# Patient Record
Sex: Female | Born: 1990 | Race: Black or African American | Hispanic: No | Marital: Single | Smoking: Never smoker
Health system: Southern US, Community
[De-identification: ages and names within clinical notes are randomized; demographics above are authoritative.]

## PROBLEM LIST (undated history)

## (undated) DIAGNOSIS — T7840XA Allergy, unspecified, initial encounter: Secondary | ICD-10-CM

## (undated) HISTORY — DX: Allergy, unspecified, initial encounter: T78.40XA

---
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
## 2008-01-22 ENCOUNTER — Emergency Department (HOSPITAL_COMMUNITY): Admission: EM | Admit: 2008-01-22 | Discharge: 2008-01-23 | Payer: Self-pay | Admitting: Emergency Medicine

## 2008-12-04 ENCOUNTER — Emergency Department (HOSPITAL_COMMUNITY): Admission: EM | Admit: 2008-12-04 | Discharge: 2008-12-04 | Payer: Self-pay | Admitting: Emergency Medicine

## 2009-09-25 ENCOUNTER — Emergency Department (HOSPITAL_COMMUNITY): Admission: EM | Admit: 2009-09-25 | Discharge: 2009-09-25 | Payer: Self-pay | Admitting: Emergency Medicine

## 2010-01-01 ENCOUNTER — Emergency Department (HOSPITAL_COMMUNITY)
Admission: EM | Admit: 2010-01-01 | Discharge: 2010-01-01 | Payer: Self-pay | Source: Home / Self Care | Admitting: Family Medicine

## 2010-03-21 ENCOUNTER — Inpatient Hospital Stay (INDEPENDENT_AMBULATORY_CARE_PROVIDER_SITE_OTHER)
Admission: RE | Admit: 2010-03-21 | Discharge: 2010-03-21 | Disposition: A | Discharge status: Home / Self Care | Payer: BC Managed Care – PPO | Source: Ambulatory Visit | Attending: Emergency Medicine | Admitting: Emergency Medicine

## 2010-03-21 DIAGNOSIS — N760 Acute vaginitis: Secondary | ICD-10-CM

## 2010-03-21 DIAGNOSIS — A499 Bacterial infection, unspecified: Secondary | ICD-10-CM

## 2010-03-21 LAB — WET PREP, GENITAL: Trich, Wet Prep: NONE SEEN

## 2010-03-21 LAB — POCT URINALYSIS DIPSTICK
Bilirubin Urine: NEGATIVE
Nitrite: NEGATIVE
Specific Gravity, Urine: 1.015 (ref 1.005–1.030)
Urobilinogen, UA: 0.2 mg/dL (ref 0.0–1.0)
pH: 7 (ref 5.0–8.0)

## 2010-03-22 LAB — GC/CHLAMYDIA PROBE AMP, GENITAL
Chlamydia, DNA Probe: NEGATIVE
GC Probe Amp, Genital: NEGATIVE

## 2010-04-06 LAB — POCT URINALYSIS DIPSTICK
Ketones, ur: NEGATIVE mg/dL
Nitrite: NEGATIVE
Protein, ur: 30 mg/dL — AB

## 2010-04-06 LAB — POCT PREGNANCY, URINE: Preg Test, Ur: NEGATIVE

## 2010-04-06 LAB — GC/CHLAMYDIA PROBE AMP, GENITAL: Chlamydia, DNA Probe: NEGATIVE

## 2010-04-06 LAB — WET PREP, GENITAL

## 2010-08-07 ENCOUNTER — Emergency Department (HOSPITAL_COMMUNITY): Payer: BC Managed Care – PPO

## 2010-08-07 ENCOUNTER — Emergency Department (HOSPITAL_COMMUNITY)
Admission: EM | Admit: 2010-08-07 | Discharge: 2010-08-07 | Disposition: A | Discharge status: Home / Self Care | Payer: BC Managed Care – PPO | Attending: Emergency Medicine | Admitting: Emergency Medicine

## 2010-08-07 DIAGNOSIS — R079 Chest pain, unspecified: Secondary | ICD-10-CM | POA: Insufficient documentation

## 2010-08-07 DIAGNOSIS — R0602 Shortness of breath: Secondary | ICD-10-CM | POA: Insufficient documentation

## 2010-08-07 DIAGNOSIS — R071 Chest pain on breathing: Secondary | ICD-10-CM | POA: Insufficient documentation

## 2010-10-27 LAB — STREP A DNA PROBE: Group A Strep Probe: NEGATIVE

## 2010-10-27 LAB — RAPID STREP SCREEN (MED CTR MEBANE ONLY): Streptococcus, Group A Screen (Direct): NEGATIVE

## 2011-07-10 ENCOUNTER — Encounter (HOSPITAL_COMMUNITY): Payer: Self-pay

## 2011-07-10 ENCOUNTER — Emergency Department (INDEPENDENT_AMBULATORY_CARE_PROVIDER_SITE_OTHER)
Admission: EM | Admit: 2011-07-10 | Discharge: 2011-07-10 | Disposition: A | Discharge status: Home / Self Care | Payer: Self-pay | Source: Home / Self Care | Attending: Family Medicine | Admitting: Family Medicine

## 2011-07-10 DIAGNOSIS — M62838 Other muscle spasm: Secondary | ICD-10-CM

## 2011-07-10 MED ORDER — CYCLOBENZAPRINE HCL 10 MG PO TABS: 10.0000 mg | ORAL_TABLET | Freq: Two times a day (BID) | ORAL | Status: AC | PRN

## 2011-07-10 MED ORDER — IBUPROFEN 800 MG PO TABS: 800.0000 mg | ORAL_TABLET | Freq: Three times a day (TID) | ORAL | Status: AC
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
## 2011-07-11 NOTE — ED Provider Notes (Signed)
Medical screening examination/treatment/procedure(s) were performed by non-physician practitioner and as supervising physician I was immediately available for consultation/collaboration.   MORENO-COLL,Kharma Sampsel; MD   Brooklynne Pereida Moreno-Coll, MD 07/11/11 0954 

## 2011-12-28 ENCOUNTER — Ambulatory Visit (INDEPENDENT_AMBULATORY_CARE_PROVIDER_SITE_OTHER): Payer: BC Managed Care – PPO | Admitting: Physician Assistant

## 2011-12-28 VITALS — BP 110/72 | HR 112 | Temp 102.5°F | Resp 20 | Ht 66.0 in | Wt 228.0 lb

## 2011-12-28 DIAGNOSIS — R05 Cough: Secondary | ICD-10-CM

## 2011-12-28 DIAGNOSIS — J029 Acute pharyngitis, unspecified: Secondary | ICD-10-CM

## 2011-12-28 DIAGNOSIS — R509 Fever, unspecified: Secondary | ICD-10-CM

## 2011-12-28 DIAGNOSIS — J111 Influenza due to unidentified influenza virus with other respiratory manifestations: Secondary | ICD-10-CM

## 2011-12-28 DIAGNOSIS — R079 Chest pain, unspecified: Secondary | ICD-10-CM

## 2011-12-28 DIAGNOSIS — J101 Influenza due to other identified influenza virus with other respiratory manifestations: Secondary | ICD-10-CM

## 2011-12-28 LAB — POCT INFLUENZA A/B: Influenza B, POC: NEGATIVE

## 2011-12-28 MED ORDER — IBUPROFEN 200 MG PO TABS: 400.0000 mg | ORAL_TABLET | Freq: Once | ORAL | Status: DC

## 2011-12-28 MED ORDER — OSELTAMIVIR PHOSPHATE 75 MG PO CAPS: 75.0000 mg | ORAL_CAPSULE | Freq: Two times a day (BID) | ORAL | Status: DC

## 2011-12-28 NOTE — Patient Instructions (Addendum)
Influenza, Adult Influenza ("the flu") is a viral infection of the respiratory tract. It occurs more often in winter months because people spend more time in close contact with one another. Influenza can make you feel very sick. Influenza easily spreads from person to person (contagious). CAUSES   Influenza is caused by a virus that infects the respiratory tract. You can catch the virus by breathing in droplets from an infected person's cough or sneeze. You can also catch the virus by touching something that was recently contaminated with the virus and then touching your mouth, nose, or eyes. SYMPTOMS   Symptoms typically last 4 to 10 days and may include:  Fever.   Chills.   Headache, body aches, and muscle aches.   Sore throat.   Chest discomfort and cough.   Poor appetite.   Weakness or feeling tired.   Dizziness.   Nausea or vomiting.  DIAGNOSIS   Diagnosis of influenza is often made based on your history and a physical exam. A nose or throat swab test can be done to confirm the diagnosis. RISKS AND COMPLICATIONS You may be at risk for a more severe case of influenza if you smoke cigarettes, have diabetes, have chronic heart disease (such as heart failure) or lung disease (such as asthma), or if you have a weakened immune system. Elderly people and pregnant women are also at risk for more serious infections. The most common complication of influenza is a lung infection (pneumonia). Sometimes, this complication can require emergency medical care and may be life-threatening. PREVENTION   An annual influenza vaccination (flu shot) is the best way to avoid getting influenza. An annual flu shot is now routinely recommended for all adults in the [place] TREATMENT   In mild cases, influenza goes away on its own. Treatment is directed at relieving symptoms. For more severe cases, your caregiver may prescribe antiviral medicines to shorten the sickness. Antibiotic medicines are not effective,  because the infection is caused by a virus, not by bacteria. HOME CARE INSTRUCTIONS  Only take over-the-counter or prescription medicines for pain, discomfort, or fever as directed by your caregiver.   Use a cool mist humidifier to make breathing easier.   Get plenty of rest until your temperature returns to normal. This usually takes 3 to 4 days.   Drink enough fluids to keep your urine clear or pale yellow.   Cover your mouth and nose when coughing or sneezing, and wash your hands well to avoid spreading the virus.   Stay home from work or school until your fever has been gone for at least 1 full day.  SEEK MEDICAL CARE IF:    You have chest pain or a deep cough that worsens or produces more mucus.   You have nausea, vomiting, or diarrhea.  SEEK IMMEDIATE MEDICAL CARE IF:    You have difficulty breathing, shortness of breath, or your skin or nails turn bluish.   You have severe neck pain or stiffness.   You have a severe headache, facial pain, or earache.   You have a worsening or recurring fever.   You have nausea or vomiting that cannot be controlled.  MAKE SURE YOU:  Understand these instructions.   Will watch your condition.   Will get help right away if you are not doing well or get worse.  Document Released: 01/08/2000 Document Revised: 07/12/2011 Document Reviewed: 04/11/2011 ExitCare Patient Information 2013 [employer].    

## 2011-12-28 NOTE — Progress Notes (Signed)
Subjective:    Patient ID: Kimberly Fuller, female    DOB: [birth year], 21 y.o.   MRN: [id number]  HPI   Kimberly Fuller is a 21 yr old female complaining of 2-3 days of fever, cough, chest pain, and sore throat.  Has not taken a temp at home because she does not have a thermometer.  Temp today is 102.44F.  Having both chills and sweats.  Muscle aches and body aches.  Cough is productive of yellowish-green sputum.  No nasal congestion or rhinorrhea.  Throat pain L > R.  Left sided chest pain with cough.  Appetite is decreased but she is keeping hydrated with water.  No GI symptoms.  Has not had a flu shot.  Using Theraflu and Dayquil for symptoms with little relief.     Review of Systems  Constitutional: Positive for fever, chills and diaphoresis.  HENT: Positive for sore throat. Negative for ear pain, congestion, rhinorrhea, sneezing and postnasal drip.   Respiratory: Positive for cough. Negative for shortness of breath and wheezing.   Cardiovascular: Positive for chest pain (left side).  Gastrointestinal: Negative.   Musculoskeletal: Positive for myalgias and arthralgias.  Skin: Negative.   Neurological: Positive for headaches.       Objective:   Physical Exam  Vitals reviewed. Constitutional: She is oriented to person, place, and time. She appears well-developed and well-nourished. She appears ill.  HENT:  Head: Normocephalic and atraumatic.  Right Ear: Tympanic membrane and ear canal normal.  Left Ear: Tympanic membrane and ear canal normal.  Nose: Nose normal. Right sinus exhibits no maxillary sinus tenderness and no frontal sinus tenderness. Left sinus exhibits no maxillary sinus tenderness and no frontal sinus tenderness.  Mouth/Throat: Uvula is midline and mucous membranes are normal. Oropharyngeal exudate and posterior oropharyngeal erythema present. No posterior oropharyngeal edema or tonsillar abscesses.  Neck: Neck supple.  Cardiovascular: Regular rhythm and intact distal pulses.   Tachycardia present.  Exam reveals no gallop and no friction rub.   No murmur heard. Pulmonary/Chest: Effort normal and breath sounds normal. She has no wheezes. She has no rales. She exhibits tenderness (left side).  Lymphadenopathy:    She has no cervical adenopathy.  Neurological: She is alert and oriented to person, place, and time.  Skin: Skin is warm. She is diaphoretic.  Psychiatric: She has a normal mood and affect. Her behavior is normal.      Filed Vitals:   12/28/11 1134  BP: 110/72  Pulse: 112  Temp: 102.5 F (39.2 C)  Resp: 20     Results for orders placed in visit on 12/28/11  POCT RAPID STREP A (OFFICE)      Component Value Range   Rapid Strep A Screen Negative  Negative  POCT INFLUENZA A/B      Component Value Range   Influenza A, POC Positive     Influenza B, POC Negative           Assessment & Plan:   1. Influenza A  oseltamivir (TAMIFLU) 75 MG capsule  2. Fever  POCT rapid strep A, POCT Influenza A/B, ibuprofen (ADVIL,MOTRIN) tablet 400 mg  3. Cough  POCT Influenza A/B  4. Chest pain  POCT Influenza A/B  5. Sore throat  POCT rapid strep A    Kimberly Fuller is a 21 yr old female with influenza A.  Symptoms began 48-72 hours ago.  She may still be within the time frame where medication may be beneficial.  Will treat with Tamiflu x 5  days.  Chest pain somewhat reproducible on palpation, suspect it is musculoskeletal secondary to cough.  Given 400mg  ibuprofen in clinic for fever reduction.  Encouraged the use of Tylenol/Advil for symptoms.  Push fluids, plenty of rest.  I have written her out of work for the rest of this week.  Pt will call or RTC if worsening or not improving.

## 2013-08-14 ENCOUNTER — Ambulatory Visit (INDEPENDENT_AMBULATORY_CARE_PROVIDER_SITE_OTHER): Payer: BC Managed Care – PPO | Admitting: Family Medicine

## 2013-08-14 VITALS — BP 128/80 | HR 79 | Temp 98.3°F | Ht 65.0 in | Wt 224.0 lb

## 2013-08-14 DIAGNOSIS — Z0000 Encounter for general adult medical examination without abnormal findings: Secondary | ICD-10-CM | POA: Insufficient documentation

## 2013-08-14 DIAGNOSIS — R000 Tachycardia, unspecified: Secondary | ICD-10-CM

## 2013-08-14 DIAGNOSIS — F410 Panic disorder [episodic paroxysmal anxiety] without agoraphobia: Secondary | ICD-10-CM

## 2013-08-14 DIAGNOSIS — R4589 Other symptoms and signs involving emotional state: Secondary | ICD-10-CM

## 2013-08-14 DIAGNOSIS — F430 Acute stress reaction: Secondary | ICD-10-CM

## 2013-08-14 DIAGNOSIS — Z202 Contact with and (suspected) exposure to infections with a predominantly sexual mode of transmission: Secondary | ICD-10-CM

## 2013-08-14 MED ORDER — VENLAFAXINE HCL ER 37.5 MG PO CP24: 37.5000 mg | ORAL_CAPSULE | Freq: Every day | ORAL | Status: DC

## 2013-08-14 NOTE — Progress Notes (Signed)
   Subjective:    Patient ID: Kimberly MilletDeshonda Fuller, female    DOB: 02/22/1990, 23 y.o.   MRN: [id number]  HPI Patient's age 23 year old African American female presents today for complete physical exam with several complaints. Patient reports that she is otherwise healthy except for a series of recent symptoms of the past few months. Patient complains of some symptoms of intermittent chest tightness, feeling overheated or flush, swelling in her extremities, lightheadedness, headaches and nausea. Symptoms are typically triggered by stressful situations at work or care for sick the mother. She's been on symptoms of poor sleep, irritability, panic attacks, and poor mood with occasional mood swings.   23yo BP elevated at home 180 systolic 133/95. Chest tightness, headaches, light headed,headache and nausea.   Pap smear and breast exam by GYN normal in May 2015 Health maintenance patient's menstrual periods are regular on a monthly basis, with 5 days of menstrual flow, no significant cramping  Sleep: 5-6hrs Exercise: run/walk 3 days Diet: not well balance  Sexually active: partners 1, STD gonarrhea test of cure, no vaginal symptoms  Family history:  No breast cancer No urterine ca No ovarian Ca  No ovarian  EOTH - once a week, 4-5 drink at a sitting [place] a smoker  Family: Materal DM,  Mood: changes irratiablity, poor sleep, mind races, stress care for mother and work.  History of headaches: normal on right behind eye, nausea, visial changes, feels like passing out. Rates a 10/10 at worst. Not over using OTC medication   Review of Systems View systems is otherwise negative support is present in history of present illness    Objective:   Physical Exam General: She is alert and oriented well-developed Heart: Regular rate and rhythm no murmurs, thrills or gallops Lungs: Clear lungs bilaterally with no wheezing rhonchi or rales symmetric air movement Abdominal exam: No tenderness to  palpation, no organomegaly, normal bowel sounds ENT: Tympanic membranes are clear bilaterally, normal teeth dentition no erythema in the pharynx No lower extremity edema equal pulses bilateral lower extremities A flat affect, appropriate linear thought process, irrigation the office visit, no signs of tangent speaking, denies any suicidal ideations       Assessment & Plan:  Presenting today for physical exam. Patient has no risk factors to warrant any further health maintenance at this time. She's had her Pap smear breast exam and Pap smear. We will evaluating patient with STD testing today. Concerning her symptoms of headaches, heart racing suspect that these are related to panic attacks from control stress. Discussed in length with patient options to treat with an SSRI particularly Effexor as this might also help with her headache sensation. Also discussed using adjuvant therapy with behavioral health. Patient was agreeable with surgery medications this time cannot age sitting counseling at. Patient was provided with contact information for clinical psychology if needed. Will followup with her and once a night urgent care in 3 weeks to see if she's tolerating medication needs any further titration time.

## 2013-08-14 NOTE — Patient Instructions (Signed)
Arbutus Pedlaire Huprich, Clinical psychologist phone number is [phone] Isidor Holtsrin Stewart clinical psychologist phone [phone]  Follow up in 3 week on a Wednesday night  Keeping You Healthy Get These Tests 1. Blood Pressure- Have your blood pressure checked once a year by your health care provider.  Normal blood pressure is 120/80. 2. Weight- Have your body mass index (BMI) calculated to screen for obesity.  BMI is measure of body fat based on height and weight.  You can also calculate your own BMI at https://www.west-esparza.com/www.nhlbisupport.com/bmi/. 3. Cholesterol- Have your cholesterol checked every 5 years starting at age 25 then yearly starting at age 25. 4. Chlamydia, HIV, and other sexually transmitted diseases- Get screened every year until age 25, then within three months of each new sexual provider. 5. Pap Smear- Every 1-3 years; discuss with your health care provider. 6. Mammogram- Every year starting at age 25  Take these medicines  Calcium with Vitamin D-Your body needs 1200 mg of Calcium each day and [phone] IU of Vitamin D daily.  Your body can only absorb 500 mg of Calcium at a time so Calcium must be taken in 2 or 3 divided doses throughout the day.  Multivitamin with folic acid- Once daily if it is possible for you to become pregnant.  Get these Immunizations  Gardasil-Series of three doses; prevents HPV related illness such as genital warts and cervical cancer.  Menactra-Single dose; prevents meningitis.  Tetanus shot- Every 10 years.  Flu shot-Every year.  Take these steps 1. Do not smoke-Your healthcare provider can help you quit.  For tips on how to quit go to www.smokefree.gov or call 1-800 QUITNOW. 2. Be physically active- Exercise 5 days a week for at least 30 minutes.  If you are not already physically active, start slow and gradually work up to 30 minutes of moderate physical activity.  Examples of moderate activity include walking briskly, dancing, swimming, bicycling, etc. 3. Breast  Cancer- A self breast exam every month is important for early detection of breast cancer.  For more information and instruction on self breast exams, ask your healthcare provider or [place].eswww.womenshealth.gov/faq/breast-self-exam.cfm. 4. Eat a healthy diet- Eat a variety of healthy foods such as fruits, vegetables, whole grains, low fat milk, low fat cheeses, yogurt, lean meats, poultry and fish, beans, nuts, tofu, etc.  For more information go to www. Thenutritionsource.org 5. Drink alcohol in moderation- Limit alcohol intake to one drink or less per day. Never drink and drive. 6. Depression- Your emotional health is as important as your physical health.  If you're feeling down or losing interest in things you normally enjoy please talk to your healthcare provider about being screened for depression. 7. Dental visit- Brush and floss your teeth twice daily; visit your dentist twice a year. 8. Eye doctor- Get an eye exam at least every 2 years. 9. Helmet use- Always wear a helmet when riding a bicycle, motorcycle, rollerblading or skateboarding. 10. Safe sex- If you may be exposed to sexually transmitted infections, use a condom. 11. Seat belts- Seat belts can save your live; always wear one. 12. Smoke/Carbon Monoxide detectors- These detectors need to be installed on the appropriate level of your home. Replace batteries at least once a year. 13. Skin cancer- When out in the sun please cover up and use sunscreen 15 SPF or higher. 14. Violence- If anyone is threatening or hurting you, please tell your healthcare provider.

## 2013-08-15 LAB — CBC WITH DIFFERENTIAL/PLATELET
Basophils Absolute: 0 10*3/uL (ref 0.0–0.1)
Basophils Relative: 0 % (ref 0–1)
EOS ABS: 0.1 10*3/uL (ref 0.0–0.7)
Eosinophils Relative: 2 % (ref 0–5)
HCT: 36.3 % (ref 36.0–46.0)
Hemoglobin: 11.5 g/dL — ABNORMAL LOW (ref 12.0–15.0)
LYMPHS ABS: 1.9 10*3/uL (ref 0.7–4.0)
LYMPHS PCT: 29 % (ref 12–46)
MCH: 22.7 pg — ABNORMAL LOW (ref 26.0–34.0)
MCHC: 31.7 g/dL (ref 30.0–36.0)
MCV: 71.7 fL — ABNORMAL LOW (ref 78.0–100.0)
Monocytes Absolute: 0.5 10*3/uL (ref 0.1–1.0)
Monocytes Relative: 8 % (ref 3–12)
NEUTROS ABS: 4 10*3/uL (ref 1.7–7.7)
NEUTROS PCT: 61 % (ref 43–77)
PLATELETS: 261 10*3/uL (ref 150–400)
RBC: 5.06 MIL/uL (ref 3.87–5.11)
RDW: 16.1 % — ABNORMAL HIGH (ref 11.5–15.5)
WBC: 6.6 10*3/uL (ref 4.0–10.5)

## 2013-08-15 LAB — BASIC METABOLIC PANEL
BUN: 11 mg/dL (ref 6–23)
CALCIUM: 9.3 mg/dL (ref 8.4–10.5)
CHLORIDE: 104 meq/L (ref 96–112)
CO2: 26 meq/L (ref 19–32)
Creat: 0.81 mg/dL (ref 0.50–1.10)
Glucose, Bld: 80 mg/dL (ref 70–99)
Potassium: 4.2 mEq/L (ref 3.5–5.3)
SODIUM: 139 meq/L (ref 135–145)

## 2013-08-15 LAB — RPR

## 2013-08-15 LAB — HIV ANTIBODY (ROUTINE TESTING W REFLEX): HIV 1&2 Ab, 4th Generation: NONREACTIVE

## 2013-08-15 LAB — TSH: TSH: 2.361 u[IU]/mL (ref 0.350–4.500)

## 2013-08-15 NOTE — Progress Notes (Signed)
Patient discussed with Dr. Tammy Soursidiano. Agree with assessment and plan of care per her note. In summary - CPE performed with age appropriate health maintenance items discussed and given on patient instructions. Agree with trial of Efffexor for HA and suspected anxiety d/o as well as numbers for counseling Karmen Bongo(Aaron Stewart, or Arbutus Pedlaire Huprich). Recheck in 3 weeks with Dr. Tammy Soursidiano, and to establish primary provider here as well.

## 2013-08-16 ENCOUNTER — Encounter: Payer: Self-pay | Admitting: Family Medicine

## 2013-08-19 NOTE — Progress Notes (Signed)
Tried to call patient.  Wrong number.

## 2013-08-22 ENCOUNTER — Encounter: Payer: Self-pay | Admitting: Radiology

## 2013-09-03 NOTE — Progress Notes (Signed)
Unable to leave message at this time all numbers given had someone elses name on voicemail.

## 2013-09-06 ENCOUNTER — Encounter: Payer: Self-pay | Admitting: Family Medicine

## 2013-09-06 NOTE — Progress Notes (Signed)
Sent an unable to reach letter due to phone numbers being wrong.

## 2015-06-04 ENCOUNTER — Ambulatory Visit (INDEPENDENT_AMBULATORY_CARE_PROVIDER_SITE_OTHER): Payer: BLUE CROSS/BLUE SHIELD | Admitting: Family Medicine

## 2015-06-04 VITALS — BP 134/82 | HR 74 | Temp 98.3°F | Resp 16 | Ht 65.0 in | Wt 242.0 lb

## 2015-06-04 DIAGNOSIS — M25511 Pain in right shoulder: Secondary | ICD-10-CM | POA: Diagnosis not present

## 2015-06-04 DIAGNOSIS — Z862 Personal history of diseases of the blood and blood-forming organs and certain disorders involving the immune mechanism: Secondary | ICD-10-CM | POA: Diagnosis not present

## 2015-06-04 LAB — CBC
HEMATOCRIT: 37 % (ref 35.0–45.0)
Hemoglobin: 11.8 g/dL (ref 11.7–15.5)
MCH: 22.9 pg — ABNORMAL LOW (ref 27.0–33.0)
MCHC: 31.9 g/dL — ABNORMAL LOW (ref 32.0–36.0)
MCV: 71.8 fL — ABNORMAL LOW (ref 80.0–100.0)
Platelets: 279 10*3/uL (ref 140–400)
RBC: 5.15 MIL/uL — ABNORMAL HIGH (ref 3.80–5.10)
RDW: 17 % — AB (ref 11.0–15.0)
WBC: 7.4 10*3/uL (ref 3.8–10.8)

## 2015-06-04 LAB — COMPLETE METABOLIC PANEL WITH GFR
ALT: 23 U/L (ref 6–29)
AST: 22 U/L (ref 10–30)
Albumin: 4.3 g/dL (ref 3.6–5.1)
Alkaline Phosphatase: 37 U/L (ref 33–115)
BUN: 11 mg/dL (ref 7–25)
CALCIUM: 9.2 mg/dL (ref 8.6–10.2)
CO2: 25 mmol/L (ref 20–31)
Chloride: 104 mmol/L (ref 98–110)
Creat: 0.79 mg/dL (ref 0.50–1.10)
GFR, Est Non African American: >89 mL/min (ref >=60)
GLUCOSE: 74 mg/dL (ref 65–99)
POTASSIUM: 4 mmol/L (ref 3.5–5.3)
Sodium: 139 mmol/L (ref 135–146)
TOTAL PROTEIN: 7.4 g/dL (ref 6.1–8.1)
Total Bilirubin: 0.5 mg/dL (ref 0.2–1.2)

## 2015-06-04 LAB — LIPID PANEL
Cholesterol: 176 mg/dL (ref 125–200)
HDL: 75 mg/dL (ref >=46)
LDL CALC: 94 mg/dL (ref <130)
TRIGLYCERIDES: 37 mg/dL (ref <150)
Total CHOL/HDL Ratio: 2.3 Ratio (ref <=5.0)
VLDL: 7 mg/dL (ref <30)

## 2015-06-04 LAB — TSH: TSH: 1.34 mIU/L

## 2015-06-04 LAB — HEMOGLOBIN A1C
Hgb A1c MFr Bld: 5.2 % (ref <5.7)
Mean Plasma Glucose: 103 mg/dL

## 2015-06-04 NOTE — Progress Notes (Signed)
   Subjective:    Patient ID: Kimberly Fuller, female    DOB: 1991/01/20, 25 y.o.   MRN: [id number]  HPI This is a pleasant 25 yo female who is accompanied by her girlfriend. She works at [employer] in [place]. She presents today with right shoulder pain. She has had pain since high school when she played soft ball. It has bothered her off and on for several years. She reached behind her head about 2 weeks ago to get something and she had severe pain and has had pain ever since. Arm feels weak, difficult to open doors. No numbness or tingling down arm. Has tried ibuprofen and acetaminophen in small doses without improvement.   Has history of anemia, heavy periods. Family history of DM.   Past Medical History  Diagnosis Date  . Allergy    No past surgical history on file. No family history on file. Social History  Substance Use Topics  . Smoking status: Never Smoker   . Smokeless tobacco: Never Used  . Alcohol Use: Yes      Review of Systems + right shoulder pain, no neck pain, no headaches,     Objective:   Physical Exam  Constitutional: She appears well-developed and well-nourished.  HENT:  Head: Normocephalic and atraumatic.  Eyes: Conjunctivae are normal.  Cardiovascular: Normal rate and regular rhythm.   Pulmonary/Chest: Effort normal and breath sounds normal.  Musculoskeletal:       Right shoulder: She exhibits decreased range of motion, tenderness, pain and decreased strength. She exhibits no bony tenderness, no swelling, no effusion, no crepitus and no deformity.  Vitals reviewed.     BP 134/82 mmHg  Pulse 74  Temp(Src) 98.3 F (36.8 C) (Oral)  Resp 16  Ht 5\' 5"  (1.651 m)  Wt 242 lb (109.77 kg)  BMI 40.27 kg/m2  SpO2 99%  LMP 05/28/2015 (Approximate) Wt Readings from Last 3 Encounters:  06/04/15 242 lb (109.77 kg)  08/14/13 224 lb (101.606 kg)  12/28/11 228 lb (103.42 kg)       Assessment & Plan:  1. Pain in joint of right shoulder - due to  recurrent, persistent nature, will go ahead with referral - Ambulatory referral to Orthopedic Surgery  2. History of anemia - CBC - COMPLETE METABOLIC PANEL WITH GFR  3. Morbid obesity due to excess calories (HCC) - Lipid panel - TSH - COMPLETE METABOLIC PANEL WITH GFR - Hemoglobin A1c - encouraged weight loss, avoidance of soda/juice/sweet tea, white rice/potaoes/pasta/breads. Provided written info about Med. Diet.   Olean Reeeborah Heidie Krall, FNP-BC  [facility]  06/04/2015 3:40 PM

## 2015-06-04 NOTE — Addendum Note (Signed)
Addended by: Garfield CorneaMABRY, Octavie Westerhold L on: 06/04/2015 03:50 PM   Modules accepted: Kipp BroodSmartSet

## 2015-06-04 NOTE — Patient Instructions (Addendum)
For pain you can take ibuprofen 600 mg every 8 to 12 hours or naprosyn 2 tablets every 12 hours     Why follow it? Research shows. . Those who follow the Mediterranean diet have a reduced risk of heart disease  . The diet is associated with a reduced incidence of Parkinson's and Alzheimer's diseases . People following the diet may have longer life expectancies and lower rates of chronic diseases  . The Dietary Guidelines for Americans recommends the Mediterranean diet as an eating plan to promote health and prevent disease  What Is the Mediterranean Diet?  . Healthy eating plan based on typical foods and recipes of Mediterranean-style cooking . The diet is primarily a plant based diet; these foods should make up a majority of meals   Starches - Plant based foods should make up a majority of meals - They are an important sources of vitamins, minerals, energy, antioxidants, and fiber - Choose whole grains, foods high in fiber and minimally processed items  - Typical grain sources include wheat, oats, barley, corn, brown rice, bulgar, farro, millet, polenta, couscous  - Various types of beans include chickpeas, lentils, fava beans, black beans, white beans   Fruits  Veggies - Large quantities of antioxidant rich fruits & veggies; 6 or more servings  - Vegetables can be eaten raw or lightly drizzled with oil and cooked  - Vegetables common to the traditional Mediterranean Diet include: artichokes, arugula, beets, broccoli, brussel sprouts, cabbage, carrots, celery, collard greens, cucumbers, eggplant, kale, leeks, lemons, lettuce, mushrooms, okra, onions, peas, peppers, potatoes, pumpkin, radishes, rutabaga, shallots, spinach, sweet potatoes, turnips, zucchini - Fruits common to the Mediterranean Diet include: apples, apricots, avocados, cherries, clementines, dates, figs, grapefruits, grapes, melons, nectarines, oranges, peaches, pears, pomegranates, strawberries, tangerines  Fats - Replace butter  and margarine with healthy oils, such as olive oil, canola oil, and tahini  - Limit nuts to no more than a handful a day  - Nuts include walnuts, almonds, pecans, pistachios, pine nuts  - Limit or avoid candied, honey roasted or heavily salted nuts - Olives are central to the [employer]iterranean diet - can be eaten whole or used in a variety of dishes   Meats Protein - Limiting red meat: no more than a few times a month - When eating red meat: choose lean cuts and keep the portion to the size of deck of cards - Eggs: approx. 0 to 4 times a week  - Fish and lean poultry: at least 2 a week  - Healthy protein sources include, chicken, [place], lean beef, lamb - Increase intake of seafood such as tuna, salmon, trout, mackerel, shrimp, scallops - Avoid or limit high fat processed meats such as sausage and bacon  Dairy - Include moderate amounts of low fat dairy products  - Focus on healthy dairy such as fat free yogurt, skim milk, low or reduced fat cheese - Limit dairy products higher in fat such as whole or 2% milk, cheese, ice cream  Alcohol - Moderate amounts of red wine is ok  - No more than 5 oz daily for women (all ages) and men older than age 27  - No more than 10 oz of wine daily for men younger than 3565  Other - Limit sweets and other desserts  - Use herbs and spices instead of salt to flavor foods  - Herbs and spices common to the traditional Mediterranean Diet include: basil, bay leaves, chives, cloves, cumin, fennel, garlic, lavender, marjoram, mint, oregano,  parsley, pepper, rosemary, sage, savory, sumac, tarragon, thyme   It's not just a diet, it's a lifestyle:  . The Mediterranean diet includes lifestyle factors typical of those in the region  . Foods, drinks and meals are best eaten with others and savored . Daily physical activity is important for overall good health . This could be strenuous exercise like running and aerobics . This could also be more leisurely activities such as  walking, housework, yard-work, or taking the stairs . Moderation is the key; a balanced and healthy diet accommodates most foods and drinks . Consider portion sizes and frequency of consumption of certain foods   Meal Ideas & Options:  . Breakfast:  o Whole wheat toast or whole wheat English muffins with peanut butter & hard boiled egg o Steel cut oats topped with apples & cinnamon and skim milk  o Fresh fruit: banana, strawberries, melon, berries, peaches  o Smoothies: strawberries, bananas, greek yogurt, peanut butter o Low fat greek yogurt with blueberries and granola  o Egg white omelet with spinach and mushrooms o Breakfast couscous: whole wheat couscous, apricots, skim milk, cranberries  . Sandwiches:  o Hummus and grilled vegetables (peppers, zucchini, squash) on whole wheat bread   o Grilled chicken on whole wheat pita with lettuce, tomatoes, cucumbers or tzatziki  o Tuna salad on whole wheat bread: tuna salad made with greek yogurt, olives, red peppers, capers, green onions o Garlic rosemary lamb pita: lamb sauted with garlic, rosemary, salt & pepper; add lettuce, cucumber, greek yogurt to pita - flavor with lemon juice and black pepper  . Seafood:  o Mediterranean grilled salmon, seasoned with garlic, basil, parsley, lemon juice and black pepper o Shrimp, lemon, and spinach whole-grain pasta salad made with low fat greek yogurt  o Seared scallops with lemon orzo  o Seared tuna steaks seasoned salt, pepper, coriander topped with tomato mixture of olives, tomatoes, olive oil, minced garlic, parsley, green onions and cappers  . Meats:  o Herbed greek chicken salad with kalamata olives, cucumber, feta  o Red bell peppers stuffed with spinach, bulgur, lean ground beef (or lentils) & topped with feta   o Kebabs: skewers of chicken, tomatoes, onions, zucchini, squash  o [place] burgers: made with red onions, mint, dill, lemon juice, feta cheese topped with roasted red  peppers . Vegetarian o Cucumber salad: cucumbers, artichoke hearts, celery, red onion, feta cheese, tossed in olive oil & lemon juice  o Hummus and whole grain pita points with a greek salad (lettuce, tomato, feta, olives, cucumbers, red onion) o Lentil soup with celery, carrots made with vegetable broth, garlic, salt and pepper  o Tabouli salad: parsley, bulgur, mint, scallions, cucumbers, tomato, radishes, lemon juice, olive oil, salt and pepper.         IF you received an x-ray today, you will receive an invoice from [facility]. Please contact [facility] at [phone] with questions or concerns regarding your invoice.   IF you received labwork today, you will receive an invoice from [employer] [facility]. Please contact [facility] at [phone] with questions or concerns regarding your invoice.   Our billing staff will not be able to assist you with questions regarding bills from these companies.  You will be contacted with the lab results as soon as they are available. The fastest way to get your results is to activate your My Chart account. Instructions are located on the last page of this paperwork. If you have not heard from [place] regarding  the results in 2 weeks, please contact this office.     Shoulder Pain The shoulder is the joint that connects your arms to your body. The bones that form the shoulder joint include the upper arm bone (humerus), the shoulder blade (scapula), and the collarbone (clavicle). The top of the humerus is shaped like a ball and fits into a rather flat socket on the scapula (glenoid cavity). A combination of muscles and strong, fibrous tissues that connect muscles to bones (tendons) support your shoulder joint and hold the ball in the socket. Small, fluid-filled sacs (bursae) are located in different areas of the joint. They act as cushions between the bones and the overlying soft tissues and help reduce friction between the  gliding tendons and the bone as you move your arm. Your shoulder joint allows a wide range of motion in your arm. This range of motion allows you to do things like scratch your back or throw a ball. However, this range of motion also makes your shoulder more prone to pain from overuse and injury. Causes of shoulder pain can originate from both injury and overuse and usually can be grouped in the following four categories:  Redness, swelling, and pain (inflammation) of the tendon (tendinitis) or the bursae (bursitis).  Instability, such as a dislocation of the joint.  Inflammation of the joint (arthritis).  Broken bone (fracture). HOME CARE INSTRUCTIONS   Apply ice to the sore area.  Put ice in a plastic bag.  Place a towel between your skin and the bag.  Leave the ice on for 15-20 minutes, 3-4 times per day for the first 2 days, or as directed by your health care provider.  Stop using cold packs if they do not help with the pain.  If you have a shoulder sling or immobilizer, wear it as long as your caregiver instructs. Only remove it to shower or bathe. Move your arm as little as possible, but keep your hand moving to prevent swelling.  Squeeze a soft ball or foam pad as much as possible to help prevent swelling.  Only take over-the-counter or prescription medicines for pain, discomfort, or fever as directed by your caregiver. SEEK MEDICAL CARE IF:   Your shoulder pain increases, or new pain develops in your arm, hand, or fingers.  Your hand or fingers become cold and numb.  Your pain is not relieved with medicines. SEEK IMMEDIATE MEDICAL CARE IF:   Your arm, hand, or fingers are numb or tingling.  Your arm, hand, or fingers are significantly swollen or turn white or blue. MAKE SURE YOU:   Understand these instructions.  Will watch your condition.  Will get help right away if you are not doing well or get worse.   This information is not intended to replace advice given  to you by your health care provider. Make sure you discuss any questions you have with your health care provider.   Document Released: 10/20/2004 Document Revised: 01/31/2014 Document Reviewed: 05/05/2014 Elsevier Interactive Patient Education [employer]

## 2015-06-04 NOTE — Addendum Note (Signed)
Addended by: Garfield CorneaMABRY, Alroy Portela L on: 06/04/2015 04:02 PM   Modules accepted: Kipp BroodSmartSet

## 2016-06-14 ENCOUNTER — Encounter (HOSPITAL_COMMUNITY): Payer: Self-pay | Admitting: Emergency Medicine

## 2016-06-14 ENCOUNTER — Ambulatory Visit (INDEPENDENT_AMBULATORY_CARE_PROVIDER_SITE_OTHER): Payer: BLUE CROSS/BLUE SHIELD

## 2016-06-14 ENCOUNTER — Ambulatory Visit (HOSPITAL_COMMUNITY)
Admission: EM | Admit: 2016-06-14 | Discharge: 2016-06-14 | Disposition: A | Discharge status: Home / Self Care | Payer: BLUE CROSS/BLUE SHIELD | Attending: Family Medicine | Admitting: Family Medicine

## 2016-06-14 DIAGNOSIS — M25559 Pain in unspecified hip: Secondary | ICD-10-CM

## 2016-06-14 DIAGNOSIS — M25551 Pain in right hip: Secondary | ICD-10-CM

## 2016-06-14 DIAGNOSIS — S76211A Strain of adductor muscle, fascia and tendon of right thigh, initial encounter: Secondary | ICD-10-CM | POA: Diagnosis not present

## 2016-06-14 MED ORDER — KETOROLAC TROMETHAMINE 60 MG/2ML IM SOLN
60.0000 mg | Freq: Once | INTRAMUSCULAR | Status: AC
  Administered 2016-06-14: 60 mg via INTRAMUSCULAR

## 2016-06-14 MED ORDER — IBUPROFEN 600 MG PO TABS: 600.0000 mg | ORAL_TABLET | Freq: Four times a day (QID) | ORAL | 0 refills | Status: AC | PRN

## 2016-06-14 MED ORDER — KETOROLAC TROMETHAMINE 60 MG/2ML IM SOLN
INTRAMUSCULAR | Status: AC
Start: 2016-06-14 — End: 2016-06-14
  Filled 2016-06-14: qty 2

## 2016-06-14 NOTE — ED Triage Notes (Signed)
[facility] a [facility] of right hip pain off and on x 1 month. The patient denied any known injury.

## 2016-06-14 NOTE — ED Provider Notes (Signed)
CSN: [id number]     Arrival date & time 06/14/16  1753 History   None    Chief Complaint  Patient presents with  . Hip Pain   (Consider location/radiation/quality/duration/timing/severity/associated sxs/prior Treatment) Patient c/o right hip pain which has been on and off for last month.  Patient denies any specific injury.   The history is provided by the patient.  Hip Pain  This is a new problem. The problem occurs constantly. The problem has not changed since onset.Nothing aggravates the symptoms. She has tried nothing for the symptoms.    Past Medical History:  Diagnosis Date  . Allergy    History reviewed. No pertinent surgical history. History reviewed. No pertinent family history. Social History  Substance Use Topics  . Smoking status: Never Smoker  . Smokeless tobacco: Never Used  . Alcohol use Yes   OB History    No data available     Review of Systems  Constitutional: Negative.   HENT: Negative.   Eyes: Negative.   Respiratory: Negative.   Cardiovascular: Negative.   Gastrointestinal: Negative.   Endocrine: Negative.   Genitourinary: Negative.   Musculoskeletal: Positive for arthralgias.  Allergic/Immunologic: Negative.   Neurological: Negative.   Hematological: Negative.   Psychiatric/Behavioral: Negative.     Allergies  Patient has no known allergies.  Home Medications   Prior to Admission medications   Medication Sig Start Date End Date Taking? Authorizing Provider  ibuprofen (ADVIL,MOTRIN) 600 MG tablet Take 1 tablet (600 mg total) by mouth every 6 (six) hours as needed. 06/14/16   Deatra Canterxford, William J, FNP   Meds Ordered and Administered this Visit   Medications  ketorolac (TORADOL) injection 60 mg (60 mg Intramuscular Given 06/14/16 1908)    BP (!) 105/57 (BP Location: Right Arm)   Pulse 81   Temp 98.5 F (36.9 C) (Oral)   Resp 18   SpO2 100%  No data found.   Physical Exam  Constitutional: She appears well-developed and  well-nourished.  HENT:  Head: Normocephalic and atraumatic.  Eyes: Conjunctivae and EOM are normal. Pupils are equal, round, and reactive to light.  Neck: Normal range of motion.  Cardiovascular: Normal rate, regular rhythm and normal heart sounds.   Pulmonary/Chest: Effort normal and breath sounds normal.  Abdominal: Soft. Bowel sounds are normal.  Musculoskeletal: She exhibits tenderness.  Tenderness with flexion and internal and external rotation of right hip.  Nursing note and vitals reviewed.   Urgent Care Course     Procedures (including critical care time)  Labs Review Labs Reviewed - No data to display  Imaging Review Dg Hip Unilat With Pelvis 2-3 Views Right  Result Date: 06/14/2016 CLINICAL DATA:  26 y/o  F; right hip and groin pain for 1 week. EXAM: DG HIP (WITH OR WITHOUT PELVIS) 2-3V RIGHT COMPARISON:  None. FINDINGS: There is no evidence of hip fracture or dislocation. There is no evidence of arthropathy or other focal bone abnormality. IMPRESSION: Negative. Electronically Signed   By: Mitzi HansenLance  Furusawa-Stratton M.D.   On: 06/14/2016 18:43     Visual Acuity Review  Right Eye Distance:   Left Eye Distance:   Bilateral Distance:    Right Eye Near:   Left Eye Near:    Bilateral Near:         MDM   1. Right hip pain   2. Hip pain   3. Groin strain, right, initial encounter    Toradol 60mg  IM Ibuprofen 600mg  one po tid x 7 days #21  Deatra Canter, [place] 06/14/16 [phone]

## 2017-09-28 IMAGING — DX DG HIP (WITH OR WITHOUT PELVIS) 2-3V*R*
3 series · 3 of 3 positions shown · non-contrast
Comparison: None.

CLINICAL DATA: 25 y/o  F; right hip and groin pain for 1 week.

EXAM:
DG HIP (WITH OR WITHOUT PELVIS) 2-3V RIGHT

[pelvis ap]
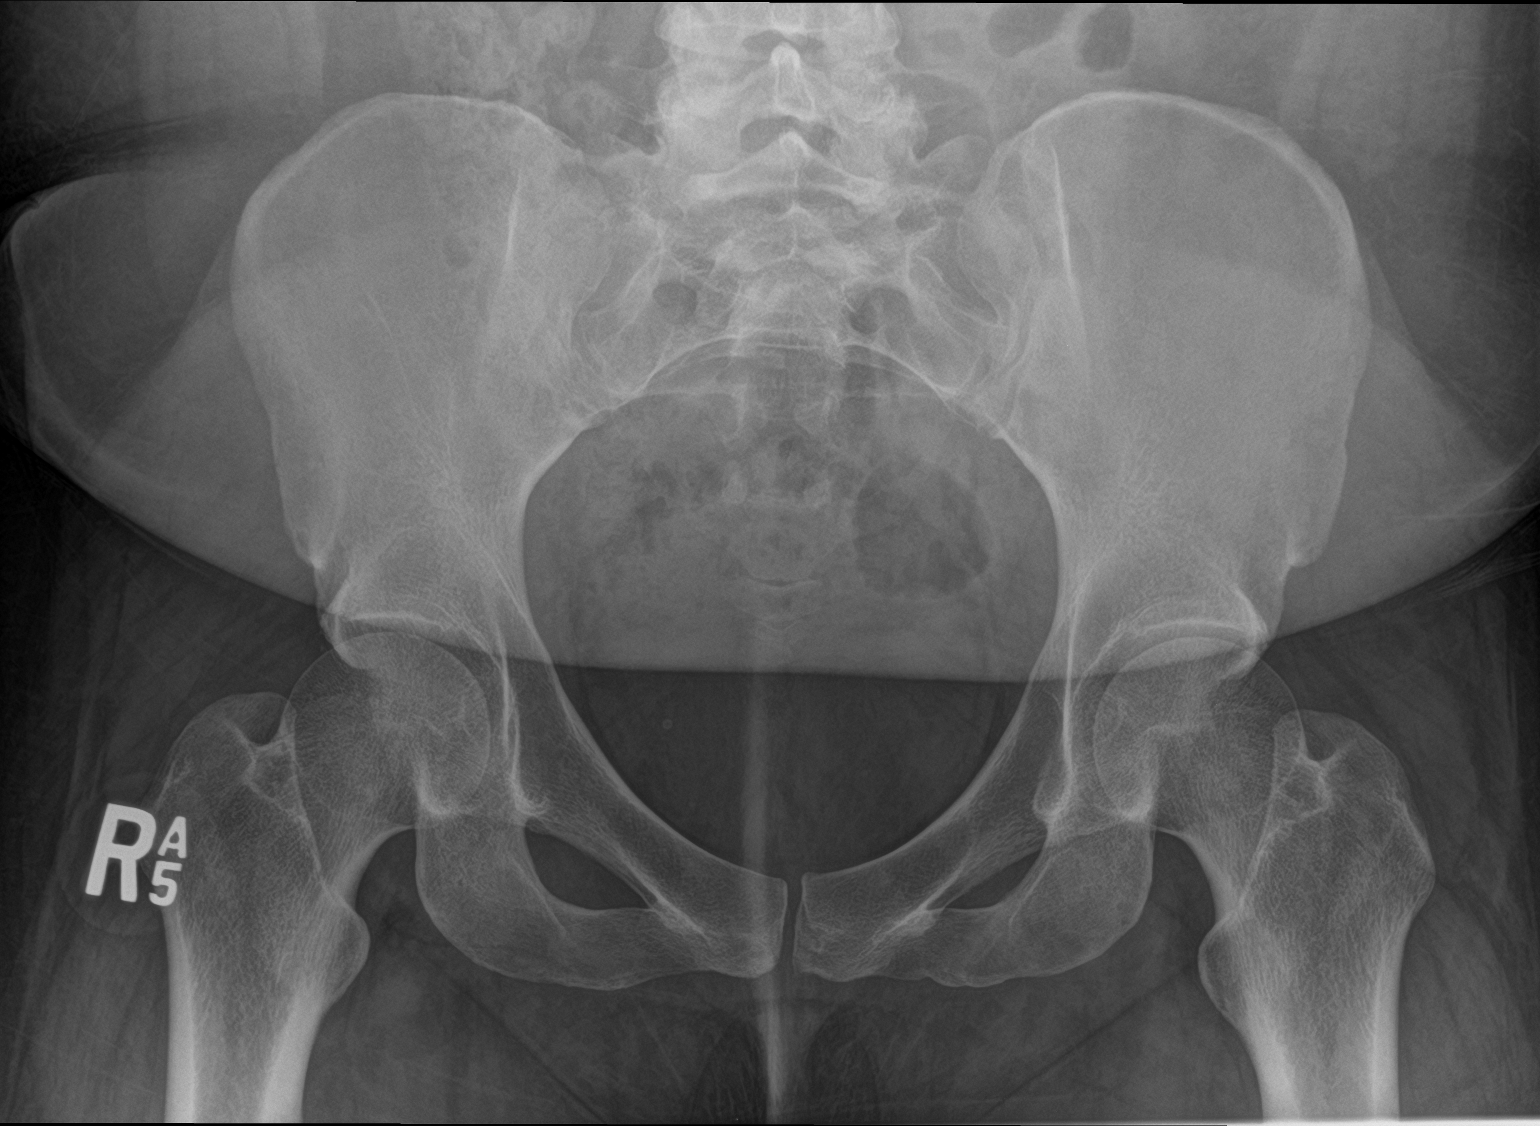

[hip ap]
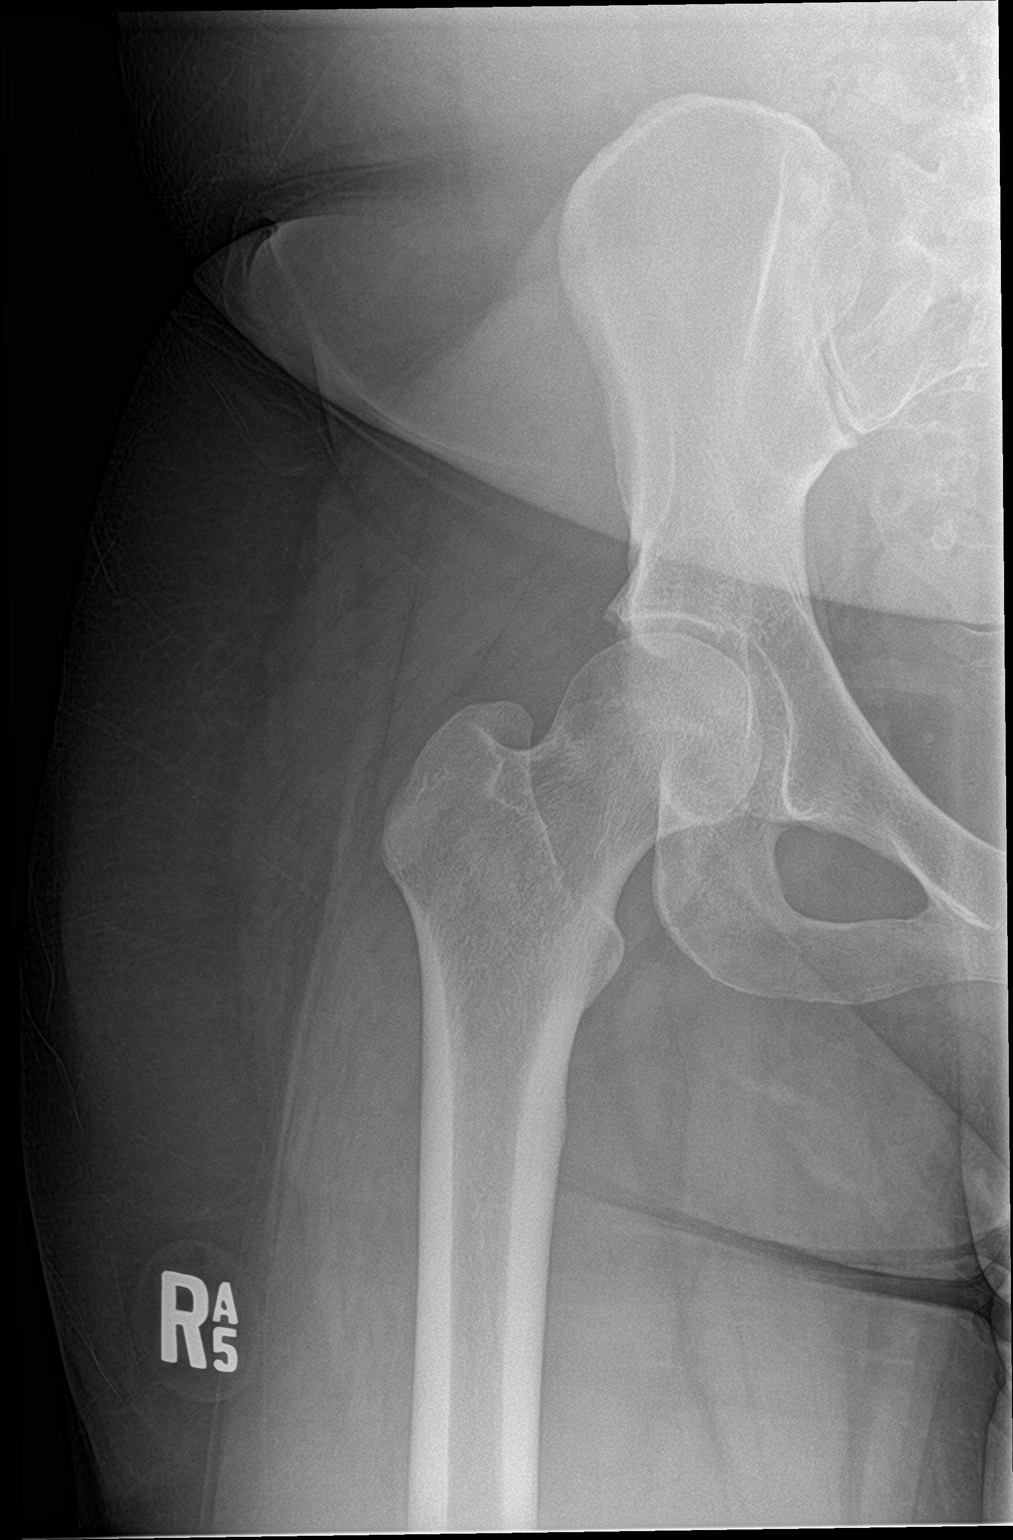

[hip lat]
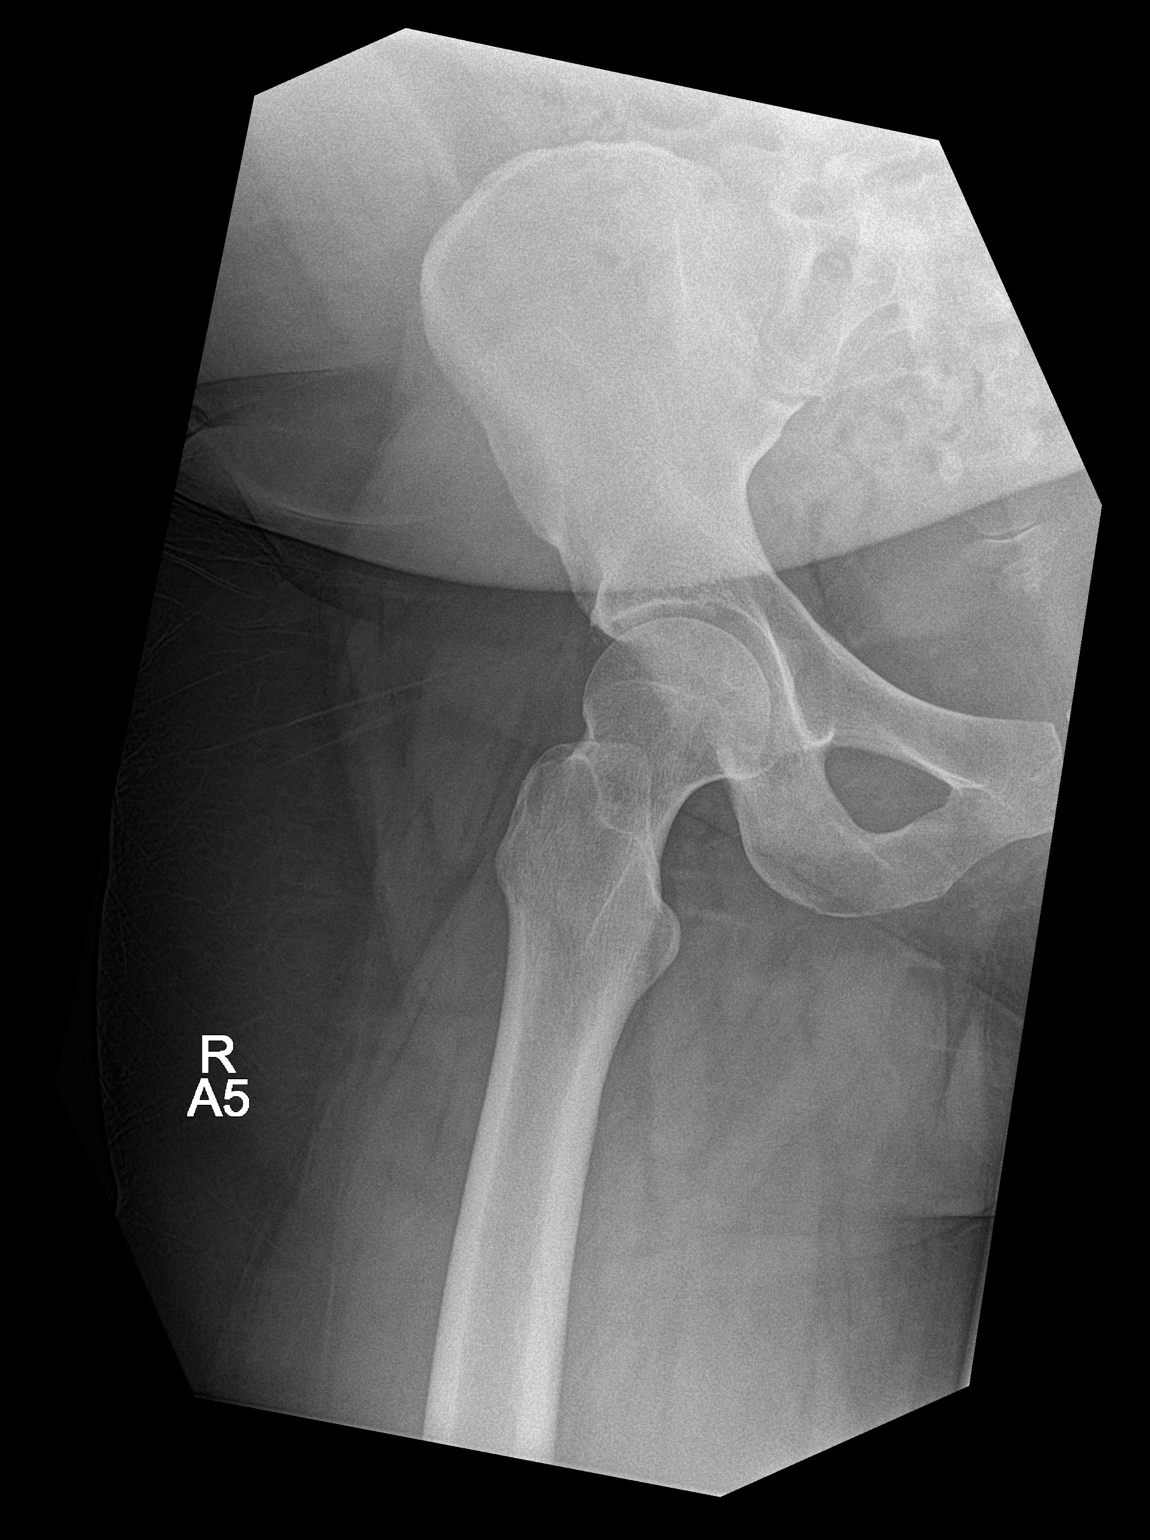

[3 of 3 positions shown; findings below may reference images not displayed]

FINDINGS: There is no evidence of hip fracture or dislocation. There is no
evidence of arthropathy or other focal bone abnormality.
IMPRESSION: Negative.

By: Helder Gomes Suellen M.D.

## 2018-08-15 ENCOUNTER — Other Ambulatory Visit: Payer: Self-pay

## 2018-08-15 ENCOUNTER — Ambulatory Visit: Payer: BC Managed Care – PPO | Admitting: Podiatry

## 2018-08-15 ENCOUNTER — Encounter: Payer: Self-pay | Admitting: Podiatry

## 2018-08-15 ENCOUNTER — Ambulatory Visit (INDEPENDENT_AMBULATORY_CARE_PROVIDER_SITE_OTHER): Payer: BC Managed Care – PPO

## 2018-08-15 VITALS — BP 107/76 | HR 88 | Temp 98.0°F

## 2018-08-15 DIAGNOSIS — M722 Plantar fascial fibromatosis: Secondary | ICD-10-CM | POA: Diagnosis not present

## 2018-08-15 MED ORDER — METHYLPREDNISOLONE 4 MG PO TBPK: ORAL_TABLET | ORAL | 0 refills | Status: AC

## 2018-08-15 MED ORDER — MELOXICAM 15 MG PO TABS: 15.0000 mg | ORAL_TABLET | Freq: Every day | ORAL | 1 refills | Status: AC

## 2018-08-22 NOTE — Progress Notes (Signed)
   Subjective: 28 y.o. female presenting today as a new patient with a chief complaint of throbbing pain noted to the plantar left heel that began 2-3 months ago. She reports associated tightness that is worsened with activity. She states it feels like there is a nodule present. She states the pain is worse in the morning. She has not had any treatment for her symptoms. Patient is here for further evaluation and treatment.   Past Medical History:  Diagnosis Date  . Allergy      Objective: Physical Exam General: The patient is alert and oriented x3 in no acute distress.  Dermatology: Skin is warm, dry and supple bilateral lower extremities. Negative for open lesions or macerations bilateral.   Vascular: Dorsalis Pedis and Posterior Tibial pulses palpable bilateral.  Capillary fill time is immediate to all digits.  Neurological: Epicritic and protective threshold intact bilateral.   Musculoskeletal: Tenderness to palpation to the plantar aspect of the left heel along the plantar fascia. All other joints range of motion within normal limits bilateral. Strength 5/5 in all groups bilateral.   Radiographic exam: Normal osseous mineralization. Joint spaces preserved. No fracture/dislocation/boney destruction. No other soft tissue abnormalities or radiopaque foreign bodies.   Assessment: 1. Plantar fasciitis left foot  Plan of Care:  1. Patient evaluated. Xrays reviewed.   2. Injection of 0.5cc Celestone soluspan injected into the left plantar fascia.  3. Rx for Medrol Dose Pak placed 4. Rx for Meloxicam ordered for patient. 5. Plantar fascial band(s) dispensed  6. Instructed patient regarding therapies and modalities at home to alleviate symptoms.  7. Return to clinic in 4 weeks.     Edrick Kins, DPM [facility]  Dr. Edrick Kins, DPM    [address], [place] [address]                Office [phone]  Fax [phone]

## 2018-09-12 ENCOUNTER — Other Ambulatory Visit: Payer: Self-pay

## 2018-09-12 ENCOUNTER — Ambulatory Visit: Payer: BC Managed Care – PPO | Admitting: Podiatry

## 2018-09-12 VITALS — Temp 97.8°F

## 2018-09-12 DIAGNOSIS — M722 Plantar fascial fibromatosis: Secondary | ICD-10-CM | POA: Diagnosis not present

## 2018-09-12 MED ORDER — DICLOFENAC SODIUM 75 MG PO TBEC: 75.0000 mg | DELAYED_RELEASE_TABLET | Freq: Two times a day (BID) | ORAL | 1 refills | Status: AC

## 2018-09-14 NOTE — Progress Notes (Signed)
   Subjective: 28 y.o. female presenting today for follow up evaluation of plantar fasciitis of the left foot. She states her pain has improved in severity and frequency. She reports some continued discomfort and tightness when walking long distances. She states the injection and using the fascial brace has helped to alleviate her symptoms. She has been taking Meloxicam but denies any significant relief. Patient is here for further evaluation and treatment.   Past Medical History:  Diagnosis Date  . Allergy      Objective: Physical Exam General: The patient is alert and oriented x3 in no acute distress.  Dermatology: Skin is warm, dry and supple bilateral lower extremities. Negative for open lesions or macerations bilateral.   Vascular: Dorsalis Pedis and Posterior Tibial pulses palpable bilateral.  Capillary fill time is immediate to all digits.  Neurological: Epicritic and protective threshold intact bilateral.   Musculoskeletal: Tenderness to palpation to the plantar aspect of the left heel along the plantar fascia. All other joints range of motion within normal limits bilateral. Strength 5/5 in all groups bilateral.   Assessment: 1. Plantar fasciitis left foot - improved   Plan of Care:  1. Patient evaluated.    2. Injection of 0.5cc Celestone soluspan injected into the left plantar fascia.  3. Rx for Diclofenac provided to patient. Discontinue taking Meloxicam.  4. Appointment with Liliane Channel, Pedorthist, for custom molded orthotics.  5. Return to clinic as needed.   Works at [employer] for the past ten years. 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     Edrick Kins, DPM [facility]  Dr. Edrick Kins, DPM    [address], [place] [address]                Office [phone]  Fax [phone]

## 2018-09-24 ENCOUNTER — Other Ambulatory Visit: Payer: BC Managed Care – PPO | Admitting: Orthotics

## 2019-06-07 ENCOUNTER — Other Ambulatory Visit: Payer: Self-pay | Admitting: Podiatry

## 2020-06-04 ENCOUNTER — Ambulatory Visit (HOSPITAL_COMMUNITY)
Admission: EM | Admit: 2020-06-04 | Discharge: 2020-06-04 | Disposition: A | Discharge status: Home / Self Care | Payer: BLUE CROSS/BLUE SHIELD

## 2020-06-04 ENCOUNTER — Encounter (HOSPITAL_COMMUNITY): Payer: Self-pay

## 2020-06-04 DIAGNOSIS — S39012A Strain of muscle, fascia and tendon of lower back, initial encounter: Secondary | ICD-10-CM | POA: Diagnosis not present

## 2020-06-04 MED ORDER — PREDNISONE 20 MG PO TABS: 40.0000 mg | ORAL_TABLET | Freq: Every day | ORAL | 0 refills | Status: AC

## 2020-06-04 MED ORDER — CYCLOBENZAPRINE HCL 5 MG PO TABS: ORAL_TABLET | ORAL | 0 refills | Status: DC

## 2020-06-04 NOTE — ED Triage Notes (Signed)
Pt reports left sided lower back pain and left leg pain since this morning. Pain is worse when walking. Aleve gives relief, last dose 1400 today.

## 2020-06-05 ENCOUNTER — Telehealth (HOSPITAL_COMMUNITY): Payer: Self-pay | Admitting: Emergency Medicine

## 2020-06-05 MED ORDER — CYCLOBENZAPRINE HCL 5 MG PO TABS: ORAL_TABLET | ORAL | 0 refills | Status: DC

## 2020-06-05 NOTE — ED Provider Notes (Signed)
[facility]    CSN: [id number] Arrival date & time: 06/04/20  1847      History   Chief Complaint Chief Complaint  Patient presents with  . Back Pain    HPI Kimberly Fuller is a 30 y.o. female.   Patient presenting today with 1 day history of left low back pain going down into left hip that started this morning.  Pain is worse with weightbearing and motion.  Denies any numbness, tingling, weakness, bowel or bladder incontinence, fever, chills.  Unsure of an instance where she may have injured herself though does do heavy lifting at work regularly.  Has been taking Aleve with mild relief temporarily.    Past Medical History:  Diagnosis Date  . Allergy     Patient Active Problem List   Diagnosis Date Noted  . Well woman exam (no gynecological exam) 08/14/2013  . Panic attack as reaction to stress 08/14/2013  . Racing heart beat 08/14/2013    History reviewed. No pertinent surgical history.  OB History   No obstetric history on file.      Home Medications    Prior to Admission medications   Medication Sig Start Date End Date Taking? Authorizing Provider  cyclobenzaprine (FLEXERIL) 5 MG tablet Do not drink alcohol or drive while taking this medication.  May cause drowsiness 06/04/20  Yes Particia Nearing, PA-C  naproxen sodium (ALEVE) 220 MG tablet Take 220 mg by mouth.   Yes [provider]  predniSONE (DELTASONE) 20 MG tablet Take 2 tablets (40 mg total) by mouth daily with breakfast. 06/04/20  Yes Particia Nearing, PA-C  diclofenac (VOLTAREN) 75 MG EC tablet Take 1 tablet (75 mg total) by mouth 2 (two) times daily. 09/12/18   Felecia Shelling, DPM  ibuprofen (ADVIL,MOTRIN) 600 MG tablet Take 1 tablet (600 mg total) by mouth every 6 (six) hours as needed. 06/14/16   Deatra Canter, FNP  meloxicam (MOBIC) 15 MG tablet Take 1 tablet (15 mg total) by mouth daily. Patient not taking: No sig reported 08/15/18   Felecia Shelling, DPM   methylPREDNISolone (MEDROL DOSEPAK) 4 MG TBPK tablet 6 day dose pack - take as directed 08/15/18   Felecia Shelling, DPM    Family History History reviewed. No pertinent family history.  Social History Social History   Tobacco Use  . Smoking status: Never Smoker  . Smokeless tobacco: Never Used  Substance Use Topics  . Alcohol use: Yes  . Drug use: Yes    Types: Marijuana     Allergies   Patient has no known allergies.   Review of Systems Review of Systems Per HPI  Physical Exam Triage Vital Signs ED Triage Vitals  Enc Vitals Group     BP 06/04/20 2008 112/82     Pulse Rate 06/04/20 2008 74     Resp 06/04/20 2008 18     Temp 06/04/20 2008 97.9 F (36.6 C)     Temp src --      SpO2 06/04/20 2008 99 %     Weight --      Height --      Head Circumference --      Peak Flow --      Pain Score 06/04/20 2006 8     Pain Loc --      Pain Edu? --      Excl. in GC? --    No data found.  Updated Vital Signs BP 112/82 (BP Location:  Right Arm)   Pulse 74   Temp 97.9 F (36.6 C)   Resp 18   LMP 06/03/2020 (Exact Date)   SpO2 99%   Visual Acuity Right Eye Distance:   Left Eye Distance:   Bilateral Distance:    Right Eye Near:   Left Eye Near:    Bilateral Near:     Physical Exam Vitals and nursing note reviewed.  Constitutional:      Appearance: Normal appearance. She is not ill-appearing.  HENT:     Head: Atraumatic.  Eyes:     Extraocular Movements: Extraocular movements intact.     Conjunctiva/sclera: Conjunctivae normal.  Cardiovascular:     Rate and Rhythm: Normal rate and regular rhythm.     Heart sounds: Normal heart sounds.  Pulmonary:     Effort: Pulmonary effort is normal.     Breath sounds: Normal breath sounds.  Abdominal:     General: Bowel sounds are normal. There is no distension.     Palpations: Abdomen is soft.     Tenderness: There is no abdominal tenderness. There is no guarding.  Musculoskeletal:        General: Tenderness  present. No swelling, deformity or signs of injury. Normal range of motion.     Cervical back: Normal range of motion and neck supple.     Comments: Negative straight leg raise bilaterally Lateral left lumbar muscular tenderness to palpation  Skin:    General: Skin is warm and dry.  Neurological:     Mental Status: She is alert and oriented to person, place, and time.     Comments: Bilateral lower extremities neurovascularly intact  Psychiatric:        Mood and Affect: Mood normal.        Thought Content: Thought content normal.        Judgment: Judgment normal.     UC Treatments / Results  Labs (all labs ordered are listed, but only abnormal results are displayed) Labs Reviewed - No data to display  EKG   Radiology No results found.  Procedures Procedures (including critical care time)  Medications Ordered in UC Medications - No data to display  Initial Impression / Assessment and Plan / UC Course  I have reviewed the triage vital signs and the nursing notes.  Pertinent labs & imaging results that were available during my care of the patient were reviewed by me and considered in my medical decision making (see chart for details).     Suspect muscular nature, will treat with prednisone burst, Flexeril as needed with precautions.  Discussed heat, rest, stretches, massage.  Work note given.  Follow-up with sports med if not resolving.  Final Clinical Impressions(s) / UC Diagnoses   Final diagnoses:  Strain of lumbar region, initial encounter   Discharge Instructions   None    ED Prescriptions    Medication Sig Dispense Auth. Provider   predniSONE (DELTASONE) 20 MG tablet Take 2 tablets (40 mg total) by mouth daily with breakfast. 10 tablet Particia Nearing, PA-C   cyclobenzaprine (FLEXERIL) 5 MG tablet Do not drink alcohol or drive while taking this medication.  May cause drowsiness 15 tablet Particia Nearing, [place]     PDMP not reviewed this  encounter.   Particia Nearing, [place] 06/05/20 1011

## 2020-06-05 NOTE — Telephone Encounter (Signed)
Verified with pharmacy that they did not receive prescription for muscle relaxer, resending

## 2022-10-28 ENCOUNTER — Ambulatory Visit (HOSPITAL_COMMUNITY)
Admission: EM | Admit: 2022-10-28 | Discharge: 2022-10-28 | Disposition: A | Discharge status: Home / Self Care | Payer: 59 | Attending: Physician Assistant | Admitting: Physician Assistant

## 2022-10-28 ENCOUNTER — Encounter (HOSPITAL_COMMUNITY): Payer: Self-pay | Admitting: Emergency Medicine

## 2022-10-28 DIAGNOSIS — S39012A Strain of muscle, fascia and tendon of lower back, initial encounter: Secondary | ICD-10-CM

## 2022-10-28 MED ORDER — NAPROXEN 500 MG PO TABS: 500.0000 mg | ORAL_TABLET | Freq: Two times a day (BID) | ORAL | 0 refills | Status: AC

## 2022-10-28 MED ORDER — CYCLOBENZAPRINE HCL 10 MG PO TABS: 10.0000 mg | ORAL_TABLET | Freq: Three times a day (TID) | ORAL | 0 refills | Status: AC | PRN

## 2022-10-28 NOTE — ED Provider Notes (Signed)
[facility]    CSN: [id number] Arrival date & time: 10/28/22  1925      History   Chief Complaint Chief Complaint  Patient presents with   Back Pain    HPI Kimberly Fuller is a 32 y.o. female.   Pt complains of left lower back pain that started yesterday.  Pt denies specific injury or trauma, but reports working out recently and her job requiring heavy lifting.  She denies radiation of sx, numbness or tingling, saddle anesthesia.  She has tried aleve with minimal relief.  Pain is worse with movement     Past Medical History:  Diagnosis Date   Allergy     Patient Active Problem List   Diagnosis Date Noted   Well woman exam (no gynecological exam) 08/14/2013   Panic attack as reaction to stress 08/14/2013   Racing heart beat 08/14/2013    History reviewed. No pertinent surgical history.  OB History   No obstetric history on file.      Home Medications    Prior to Admission medications   Medication Sig Start Date End Date Taking? Authorizing Provider  cyclobenzaprine (FLEXERIL) 10 MG tablet Take 1 tablet (10 mg total) by mouth 3 (three) times daily as needed for up to 10 days for muscle spasms. 10/28/22 11/07/22 Yes Ward, Tylene Fantasia, PA-C  naproxen (NAPROSYN) 500 MG tablet Take 1 tablet (500 mg total) by mouth 2 (two) times daily. 10/28/22  Yes Ward, Tylene Fantasia, PA-C  diclofenac (VOLTAREN) 75 MG EC tablet Take 1 tablet (75 mg total) by mouth 2 (two) times daily. 09/12/18   Felecia Shelling, DPM  ibuprofen (ADVIL,MOTRIN) 600 MG tablet Take 1 tablet (600 mg total) by mouth every 6 (six) hours as needed. 06/14/16   Deatra Canter, FNP  meloxicam (MOBIC) 15 MG tablet Take 1 tablet (15 mg total) by mouth daily. Patient not taking: No sig reported 08/15/18   Felecia Shelling, DPM  methylPREDNISolone (MEDROL DOSEPAK) 4 MG TBPK tablet 6 day dose pack - take as directed 08/15/18   Felecia Shelling, DPM  naproxen sodium (ALEVE) 220 MG tablet Take 220 mg by mouth.     [provider]  predniSONE (DELTASONE) 20 MG tablet Take 2 tablets (40 mg total) by mouth daily with breakfast. 06/04/20   Particia Nearing, PA-C    Family History No family history on file.  Social History Social History   Tobacco Use   Smoking status: Never   Smokeless tobacco: Never  Substance Use Topics   Alcohol use: Yes   Drug use: Yes    Types: Marijuana     Allergies   Patient has no known allergies.   Review of Systems Review of Systems  Constitutional:  Negative for chills and fever.  HENT:  Negative for ear pain and sore throat.   Eyes:  Negative for pain and visual disturbance.  Respiratory:  Negative for cough and shortness of breath.   Cardiovascular:  Negative for chest pain and palpitations.  Gastrointestinal:  Negative for abdominal pain and vomiting.  Genitourinary:  Negative for dysuria and hematuria.  Musculoskeletal:  Positive for back pain. Negative for arthralgias.  Skin:  Negative for color change and rash.  Neurological:  Negative for seizures and syncope.  All other systems reviewed and are negative.    Physical Exam Triage Vital Signs ED Triage Vitals  Encounter Vitals Group     BP 10/28/22 1946 113/69     Systolic BP Percentile --  Diastolic BP Percentile --      Pulse Rate 10/28/22 1946 68     Resp 10/28/22 1946 16     Temp 10/28/22 1946 98.6 F (37 C)     Temp src --      SpO2 10/28/22 1946 97 %     Weight --      Height --      Head Circumference --      Peak Flow --      Pain Score 10/28/22 1944 7     Pain Loc --      Pain Education --      Exclude from Growth Chart --    No data found.  Updated Vital Signs BP 113/69 (BP Location: Right Arm)   Pulse 68   Temp 98.6 F (37 C)   Resp 16   LMP 10/22/2022   SpO2 97%   Visual Acuity Right Eye Distance:   Left Eye Distance:   Bilateral Distance:    Right Eye Near:   Left Eye Near:    Bilateral Near:     Physical Exam Vitals and nursing note  reviewed.  Constitutional:      General: She is not in acute distress.    Appearance: She is well-developed.  HENT:     Head: Normocephalic and atraumatic.  Eyes:     Conjunctiva/sclera: Conjunctivae normal.  Cardiovascular:     Rate and Rhythm: Normal rate and regular rhythm.     Heart sounds: No murmur heard. Pulmonary:     Effort: Pulmonary effort is normal. No respiratory distress.     Breath sounds: Normal breath sounds.  Abdominal:     Palpations: Abdomen is soft.     Tenderness: There is no abdominal tenderness.  Musculoskeletal:        General: No swelling.     Cervical back: Neck supple.       Back:  Skin:    General: Skin is warm and dry.     Capillary Refill: Capillary refill takes less than 2 seconds.  Neurological:     Mental Status: She is alert.  Psychiatric:        Mood and Affect: Mood normal.      UC Treatments / Results  Labs (all labs ordered are listed, but only abnormal results are displayed) Labs Reviewed - No data to display  EKG   Radiology No results found.  Procedures Procedures (including critical care time)  Medications Ordered in UC Medications - No data to display  Initial Impression / Assessment and Plan / UC Course  I have reviewed the triage vital signs and the nursing notes.  Pertinent labs & imaging results that were available during my care of the patient were reviewed by me and considered in my medical decision making (see chart for details).     Lumbar strain.  Flexeril prescribed, naproxen prescribed.  No red flag sx.  Supportive care discussed, return precautions discussed.  Work note given.  Final Clinical Impressions(s) / UC Diagnoses   Final diagnoses:  Strain of lumbar region, initial encounter     Discharge Instructions      Take Flexeril as needed for muscle spasm Can take Naproxen as needed Recommend gentle stretching and heat or ice to affected area    ED Prescriptions     Medication Sig  Dispense Auth. Provider   cyclobenzaprine (FLEXERIL) 10 MG tablet Take 1 tablet (10 mg total) by mouth 3 (three) times daily as needed for  up to 10 days for muscle spasms. 30 tablet Ward, Shanda Bumps Z, PA-C   naproxen (NAPROSYN) 500 MG tablet Take 1 tablet (500 mg total) by mouth 2 (two) times daily. 30 tablet Ward, Tylene Fantasia, PA-C      PDMP not reviewed this encounter.   Ward, Tylene Fantasia, PA-C 10/28/22 2017

## 2022-10-28 NOTE — ED Triage Notes (Signed)
Pt reports worked out "that was excessive, plus I am a delivery driver". C/o mid back pains that started yesterday. Reports today when delivering load was making pains worse. Tried Aleve

## 2022-10-28 NOTE — Discharge Instructions (Signed)
Take Flexeril as needed for muscle spasm Can take Naproxen as needed Recommend gentle stretching and heat or ice to affected area
